# Patient Record
Sex: Male | Born: 1985 | Race: White | Hispanic: No | Marital: Single | Smoking: Current every day smoker
Health system: Southern US, Community
[De-identification: ages and names within clinical notes are randomized; demographics above are authoritative.]

## PROBLEM LIST (undated history)

## (undated) DIAGNOSIS — F32A Depression, unspecified: Secondary | ICD-10-CM

## (undated) DIAGNOSIS — J45909 Unspecified asthma, uncomplicated: Secondary | ICD-10-CM

## (undated) DIAGNOSIS — F419 Anxiety disorder, unspecified: Secondary | ICD-10-CM

## (undated) DIAGNOSIS — F329 Major depressive disorder, single episode, unspecified: Secondary | ICD-10-CM

## (undated) HISTORY — PX: TONSILLECTOMY: SUR1361

## (undated) HISTORY — PX: WISDOM TOOTH EXTRACTION: SHX21

---

## 2015-07-17 ENCOUNTER — Encounter (HOSPITAL_COMMUNITY): Payer: Self-pay | Admitting: Emergency Medicine

## 2015-07-17 ENCOUNTER — Emergency Department (HOSPITAL_COMMUNITY): Payer: BLUE CROSS/BLUE SHIELD

## 2015-07-17 ENCOUNTER — Emergency Department (HOSPITAL_COMMUNITY)
Admission: EM | Admit: 2015-07-17 | Discharge: 2015-07-17 | Disposition: A | Discharge status: Home / Self Care | Payer: BLUE CROSS/BLUE SHIELD | Attending: Emergency Medicine | Admitting: Emergency Medicine

## 2015-07-17 DIAGNOSIS — T148XXA Other injury of unspecified body region, initial encounter: Secondary | ICD-10-CM

## 2015-07-17 DIAGNOSIS — S61355A Open bite of left ring finger with damage to nail, initial encounter: Secondary | ICD-10-CM | POA: Diagnosis present

## 2015-07-17 DIAGNOSIS — Z203 Contact with and (suspected) exposure to rabies: Secondary | ICD-10-CM | POA: Diagnosis not present

## 2015-07-17 DIAGNOSIS — Y998 Other external cause status: Secondary | ICD-10-CM | POA: Diagnosis not present

## 2015-07-17 DIAGNOSIS — J45909 Unspecified asthma, uncomplicated: Secondary | ICD-10-CM | POA: Diagnosis not present

## 2015-07-17 DIAGNOSIS — Z23 Encounter for immunization: Secondary | ICD-10-CM | POA: Diagnosis not present

## 2015-07-17 DIAGNOSIS — F17210 Nicotine dependence, cigarettes, uncomplicated: Secondary | ICD-10-CM | POA: Insufficient documentation

## 2015-07-17 DIAGNOSIS — Y9289 Other specified places as the place of occurrence of the external cause: Secondary | ICD-10-CM | POA: Insufficient documentation

## 2015-07-17 DIAGNOSIS — Y9389 Activity, other specified: Secondary | ICD-10-CM | POA: Diagnosis not present

## 2015-07-17 DIAGNOSIS — W540XXA Bitten by dog, initial encounter: Secondary | ICD-10-CM | POA: Insufficient documentation

## 2015-07-17 DIAGNOSIS — Z8659 Personal history of other mental and behavioral disorders: Secondary | ICD-10-CM | POA: Insufficient documentation

## 2015-07-17 HISTORY — DX: Unspecified asthma, uncomplicated: J45.909

## 2015-07-17 HISTORY — DX: Major depressive disorder, single episode, unspecified: F32.9

## 2015-07-17 HISTORY — DX: Depression, unspecified: F32.A

## 2015-07-17 HISTORY — DX: Anxiety disorder, unspecified: F41.9

## 2015-07-17 MED ORDER — IBUPROFEN 800 MG PO TABS: 800.0000 mg | ORAL_TABLET | Freq: Three times a day (TID) | ORAL | Status: AC

## 2015-07-17 MED ORDER — RABIES VACCINE, PCEC IM SUSR
1.0000 mL | Freq: Once | INTRAMUSCULAR | Status: AC
  Administered 2015-07-17: 1 mL via INTRAMUSCULAR
  Filled 2015-07-17: qty 1

## 2015-07-17 MED ORDER — IBUPROFEN 400 MG PO TABS
800.0000 mg | ORAL_TABLET | Freq: Once | ORAL | Status: AC
  Administered 2015-07-17: 800 mg via ORAL
  Filled 2015-07-17: qty 2

## 2015-07-17 MED ORDER — RABIES IMMUNE GLOBULIN 150 UNIT/ML IM INJ
20.0000 [IU]/kg | INJECTION | Freq: Once | INTRAMUSCULAR | Status: AC
  Administered 2015-07-17: 1875 [IU] via INTRAMUSCULAR
  Filled 2015-07-17: qty 12.5

## 2015-07-17 MED ORDER — TETANUS-DIPHTH-ACELL PERTUSSIS 5-2.5-18.5 LF-MCG/0.5 IM SUSP
0.5000 mL | Freq: Once | INTRAMUSCULAR | Status: AC
  Administered 2015-07-17: 0.5 mL via INTRAMUSCULAR
  Filled 2015-07-17: qty 0.5

## 2015-07-17 MED ORDER — AMOXICILLIN-POT CLAVULANATE 875-125 MG PO TABS: 1.0000 | ORAL_TABLET | Freq: Two times a day (BID) | ORAL | Status: AC

## 2015-07-17 NOTE — ED Provider Notes (Signed)
CSN: [id number]     Arrival date & time 07/17/15  1408 History  By signing my name below, I, Scott Warren, attest that this documentation has been prepared under the direction and in the presence of [name], PA-C. Electronically Signed: Octavia Warren, ED Scribe. 07/17/2015. 3:10 PM.    Chief Complaint  Patient presents with  . Animal Bite      The history is provided by the patient and the police. No language interpreter was used.   HPI Comments: Scott Warren is a 30 y.o. male who presents to the Emergency Department complaining of an animal bite that occurred about one hour ago. Pt received an animal bite on his left ring finger after trying to break up a fight between his dog and another dog. He states his finger has been numb since 5 minutes after to being bit. Pt is unsure if the dog who bit him has a rabies vaccination. He is not up to date on his tetanus shot. He denies any other injury. Denies weakness, fever, chills, N/V.  Past Medical History  Diagnosis Date  . Anxiety   . Depression   . Asthma     childhood asthma   Past Surgical History  Procedure Laterality Date  . Tonsillectomy    . Wisdom tooth extraction     History reviewed. No pertinent family history. Social History  Substance Use Topics  . Smoking status: Current Every Day Smoker -- 0.50 packs/day    Types: Cigarettes  . Smokeless tobacco: Never Used  . Alcohol Use: No    Review of Systems  Skin: Positive for wound.  Neurological: Positive for numbness.  All other systems reviewed and are negative.     Allergies  Review of patient's allergies indicates no known allergies.  Home Medications   Prior to Admission medications   Medication Sig Start Date End Date Taking? Authorizing Provider  amoxicillin-clavulanate (AUGMENTIN) 875-125 MG tablet Take 1 tablet by mouth every 12 (twelve) hours. 07/17/15   [name], PA-C  ibuprofen (ADVIL,MOTRIN) 800 MG tablet Take 1 tablet (800 mg total) by mouth 3  (three) times daily. 07/17/15   [name], PA-C   Triage vitals: BP 139/79 mmHg  Pulse 110  Temp(Src) 99.3 F (37.4 C) (Oral)  Resp 20  SpO2 97% Physical Exam  Constitutional: He is oriented to person, place, and time. He appears well-developed and well-nourished.  HENT:  Head: Normocephalic and atraumatic.  Right Ear: External ear normal.  Left Ear: External ear normal.  Eyes: Conjunctivae are normal. No scleral icterus.  Neck: Normal range of motion. No tracheal deviation present.  Cardiovascular:  Capillary refill less than 3 seconds distal to injury.   Pulmonary/Chest: Effort normal. No respiratory distress.  Abdominal: He exhibits no distension.  Musculoskeletal: Normal range of motion.  FAROM of DIPJ, PIPJ, and MCP of left 4th digit.  Neurological: He is alert and oriented to person, place, and time.  Strength and sensation intact distal to injury.  Skin: Skin is warm and dry.  1 cm superficial incised wound to 4th digit finger pad. Matching superficial laceration to nail.  Nail intact and stable.  No nail bed or nail matrix involvement. No subungual hematoma. Bleeding controlled with pressure.  No palpation or visualization of foreign bodies within a bloodless field.   Psychiatric: He has a normal mood and affect. His behavior is normal.  Nursing note and vitals reviewed.   ED Course  Procedures   The wound is cleansed and dressed.  The patient is alerted to watch for any signs of infection (redness, pus, pain, increased swelling or fever) and call if such occurs. Home wound care instructions are provided. Tetanus vaccination status reviewed: Td vaccination indicated and given today.  DIAGNOSTIC STUDIES: Oxygen Saturation is 97% on RA, normal by my interpretation.  COORDINATION OF CARE:  3:00 PM Discussed treatment plan which include update tetanus and rabies shots with pt at bedside and pt agreed to plan.  Labs Review Labs Reviewed - No data to display  Imaging  Review Dg Finger Ring Left  07/17/2015  CLINICAL DATA:  Dog bite to palm and distal left ring finger. EXAM: LEFT RING FINGER 2+V COMPARISON:  None. FINDINGS: Soft tissue swelling over the tip of the left ring finger. No underlying bony abnormality. No fracture, subluxation or dislocation. No radiopaque foreign bodies. IMPRESSION: No acute bony abnormality or radiopaque foreign body. Electronically Signed   By: Charlett Nose M.D.   On: 07/17/2015 15:39   I have personally reviewed and evaluated these images and lab results as part of my medical decision-making.   EKG Interpretation None      MDM   Final diagnoses:  Animal bite  Need for post exposure prophylaxis for rabies    Patient presents with dog bite with animal status unknown.  VSS, NAD.  1 cm incised wound to 4th digit finger pad.  NVI distal to injury.  Plain films negative for FB.  Wound extensively cleaned.  Patient received Tdap, rabies immune globulin, and rabies vaccine in ED.  Plan to discharge home with Augmentin and Ibuprofen.  Follow up with this department or [facility] for remaining rabies vaccinations on days 3 (March 1), 7 (March 5), and 14 (March 12).  I personally performed the services described in this documentation, which was scribed in my presence. The recorded information has been reviewed and is accurate. NA andin  [name], PA-C 07/17/15 1617  Blane Ohara, MD 07/19/15 [phone]

## 2015-07-17 NOTE — ED Notes (Signed)
Pt from home with c/o left 4th digit dog bite occuring today.  Reports another dog bit his dog and he got in between to break up the fight.  Pt does not know the other dog or dog's owner.  Bleeding controlled, NAD, A&O.

## 2015-07-17 NOTE — Discharge Instructions (Signed)
Go to Urgent Care to receive your remaining rabies vaccinations.  Today is day 0.  You need shots on Day 3 (Mar 1), 7 (March 5), and 14 (March 12).  Rabies Rabies is a viral infection that can be spread to people from infected animals. The infection affects the brain and central nervous system. Once the disease develops, it almost always causes death. Because of this, when a person is bitten by an animal that may have rabies, treatment to prevent rabies often needs to be started whether or not the animal is known to be infected. Prompt treatment with the rabies vaccine and rabies immune globulin is very effective at preventing the infection from developing in people who have been exposed to the rabies virus. CAUSES  Rabies is caused by a virus that lives inside some animals. When a person is bitten by an infected animal, the rabies virus is spread to the person through the infected spit (saliva) of the animal. This virus can be carried by animals such as dogs, cats, skunks, bats, woodchucks, raccoons, coyotes, and foxes. SYMPTOMS  By the time symptoms appear, rabies is usually fatal for the person. Common symptoms include:  Headache.  Fever.  Fatigue and weakness.  Agitation.  Anxiety.  Confusion.  Unusual behavior, such as hyperactivity, fear of water (hydrophobia), or fear of air (aerophobia).  Hallucinations.  Insomnia.  Weakness in the arms or legs.  Difficulty swallowing. Most people get sick in 1-3 months after being bitten. This often varies and may depend on the location of the bite. The infection will take less time to develop if the bite occurred closer to the head.  DIAGNOSIS  To determine if a person is infected, several tests must be performed, such as:  A skin biopsy.  A saliva test.  A lumbar puncture to remove spinal fluid so it can be examined.  Blood tests. TREATMENT  Treatment to prevent the infection from developing (post-exposure prophylaxis, PEP) is  often started before knowing for sure if the person has been exposed to the rabies virus. PEP involves cleaning the wound, giving an antibody injection (rabies immune globulin), and giving a series of rabies vaccine injections. The series of injections are usually given over a two-week period. If possible, the animal that bit the person will be observed to see if it remains healthy. If the animal has been killed, it can be sent to a state laboratory and examined to see if the animal had rabies. If a person is bitten by a domestic animal (dog, cat, or ferret) that appears healthy and can be observed to see if it remains healthy, often no further treatment is necessary other than care of the wounds caused by the animal. Rabies is often a fatal illness once the infection develops in a person. Although a few people who developed rabies have survived after experimental treatment with certain drugs, all these survivors still had severe nervous system problems after the treatment. This is why caregivers use extra caution and begin PEP treatment for people who have been bitten by animals that are possibly infected with rabies.  HOME CARE INSTRUCTIONS  If you were bitten by an unknown animal, make sure you know your caregiver's instructions for follow-up. If the animal was sent to a laboratory for examination, ask when the test results will be ready. Make sure you get the test results.  Take these steps to care for your wound:  Keep the wound clean, dry, and dressed as directed by your caregiver.  Keep the injured part elevated as much as possible.  Do not resume use of the affected area until directed.  Only take over-the-counter or prescription medicines as directed by your caregiver.  Keep all follow-up appointments as directed by your caregiver. PREVENTION  To prevent rabies, people need to reduce their risk of having contact with infected animals.   Make sure your pets (dogs, cats, ferrets) are  vaccinated against rabies. Keep these vaccinations up-to-date as directed by your veterinarian.  Supervise your pets when they are outside. Keep them away from wild animals.  Call your local animal control services to report any stray animals. These animals may not be vaccinated.  Stay away from stray or wild animals.  Consider getting the rabies vaccine (preexposure) if you are traveling to an area where rabies is common or if your job or activities involve possible contact with wild or stray animals. Discuss this with your caregiver.   This information is not intended to replace advice given to you by your health care provider. Make sure you discuss any questions you have with your health care provider.   Document Released: 05/07/2005 Document Revised: 05/28/2014 Document Reviewed: 12/04/2011 Elsevier Interactive Patient Education [employer]

## 2015-07-17 NOTE — ED Notes (Signed)
Declined W/C at D/C and was escorted to lobby by RN. 

## 2015-07-20 ENCOUNTER — Encounter (HOSPITAL_COMMUNITY): Payer: Self-pay | Admitting: Emergency Medicine

## 2015-07-20 ENCOUNTER — Emergency Department (INDEPENDENT_AMBULATORY_CARE_PROVIDER_SITE_OTHER)
Admission: EM | Admit: 2015-07-20 | Discharge: 2015-07-20 | Disposition: A | Discharge status: Home / Self Care | Payer: BLUE CROSS/BLUE SHIELD | Source: Home / Self Care | Attending: Family Medicine | Admitting: Family Medicine

## 2015-07-20 DIAGNOSIS — Z203 Contact with and (suspected) exposure to rabies: Secondary | ICD-10-CM | POA: Diagnosis not present

## 2015-07-20 MED ORDER — RABIES VACCINE, PCEC IM SUSR
1.0000 mL | Freq: Once | INTRAMUSCULAR | Status: AC
Start: 2015-07-20 — End: 2015-07-20
  Administered 2015-07-20: 1 mL via INTRAMUSCULAR

## 2015-07-20 MED ORDER — RABIES VACCINE, PCEC IM SUSR
INTRAMUSCULAR | Status: AC
  Filled 2015-07-20: qty 1

## 2015-07-20 NOTE — ED Notes (Signed)
Pt is here for his second injection in his rabies series.  He received his immunoglobulin and 1st rabies vaccine on 07/17/15.  Wound was covered by a dirty bandage.  Wound is marginally clean, with bandage sticking to the wound.  Wound does not look infected at this time.  No swelling, redness, or pain and no fever reported.

## 2015-07-24 ENCOUNTER — Emergency Department (INDEPENDENT_AMBULATORY_CARE_PROVIDER_SITE_OTHER)
Admission: EM | Admit: 2015-07-24 | Discharge: 2015-07-24 | Disposition: A | Discharge status: Home / Self Care | Payer: BLUE CROSS/BLUE SHIELD | Source: Home / Self Care

## 2015-07-24 ENCOUNTER — Encounter (HOSPITAL_COMMUNITY): Payer: Self-pay | Admitting: Emergency Medicine

## 2015-07-24 DIAGNOSIS — Z203 Contact with and (suspected) exposure to rabies: Secondary | ICD-10-CM | POA: Diagnosis not present

## 2015-07-24 MED ORDER — RABIES VACCINE, PCEC IM SUSR
1.0000 mL | Freq: Once | INTRAMUSCULAR | Status: AC
  Administered 2015-07-24: 1 mL via INTRAMUSCULAR

## 2015-07-24 MED ORDER — RABIES VACCINE, PCEC IM SUSR
INTRAMUSCULAR | Status: AC
  Filled 2015-07-24: qty 1

## 2015-07-24 NOTE — ED Provider Notes (Signed)
CSN: [id number]     Arrival date & time 07/20/15  1812 History   First MD Initiated Contact with Patient 07/20/15 1903     Chief Complaint  Patient presents with  . Rabies Injection   (Consider location/radiation/quality/duration/timing/severity/associated sxs/prior Treatment) HPI  Past Medical History  Diagnosis Date  . Anxiety   . Depression   . Asthma     childhood asthma   Past Surgical History  Procedure Laterality Date  . Tonsillectomy    . Wisdom tooth extraction     History reviewed. No pertinent family history. Social History  Substance Use Topics  . Smoking status: Current Every Day Smoker -- 0.50 packs/day    Types: Cigarettes  . Smokeless tobacco: Never Used  . Alcohol Use: No    Review of Systems  Allergies  Review of patient's allergies indicates no known allergies.  Home Medications   Prior to Admission medications   Medication Sig Start Date End Date Taking? Authorizing Provider  amoxicillin-clavulanate (AUGMENTIN) 875-125 MG tablet Take 1 tablet by mouth every 12 (twelve) hours. 07/17/15  Yes Kayla Rose, PA-C  ibuprofen (ADVIL,MOTRIN) 800 MG tablet Take 1 tablet (800 mg total) by mouth 3 (three) times daily. 07/17/15  Yes Cheri FowlerKayla Rose, PA-C   Meds Ordered and Administered this Visit   Medications  rabies vaccine (RABAVERT) injection 1 mL (1 mL Intramuscular Given 07/20/15 1918)    BP 129/77 mmHg  Pulse 99  Temp(Src) 98.3 F (36.8 C) (Oral)  Resp 16  SpO2 99% No data found.   Physical Exam  ED Course  Procedures (including critical care time)  Labs Review Labs Reviewed - No data to display  Imaging Review No results found.   Visual Acuity Review  Right Eye Distance:   Left Eye Distance:   Bilateral Distance:    Right Eye Near:   Left Eye Near:    Bilateral Near:         MDM  No diagnosis found.     Linna HoffJames D Tanna Loeffler, MD 07/24/15 1318

## 2015-07-24 NOTE — Discharge Instructions (Signed)
Return on 07/31/2015 for next injection.

## 2015-07-24 NOTE — ED Notes (Signed)
[facility] rabies injection in his series. [facility].

## 2016-07-05 IMAGING — DX DG FINGER RING 2+V*L*
3 series · 3 of 3 positions shown · non-contrast
Comparison: None.

CLINICAL DATA: Dog bite to palm and distal left ring finger.

EXAM:
LEFT RING FINGER 2+V

[finger ap]
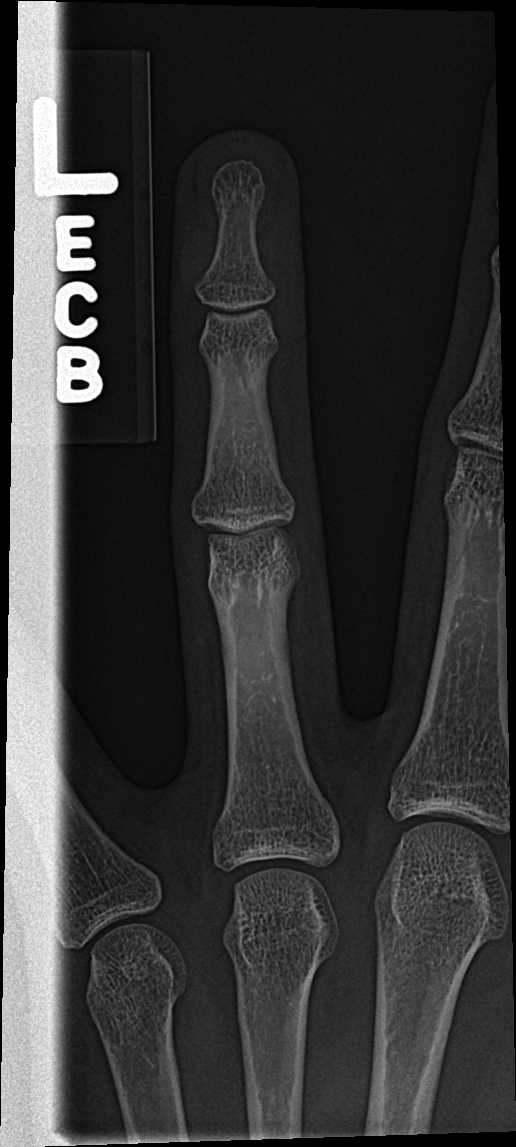

[finger obl]
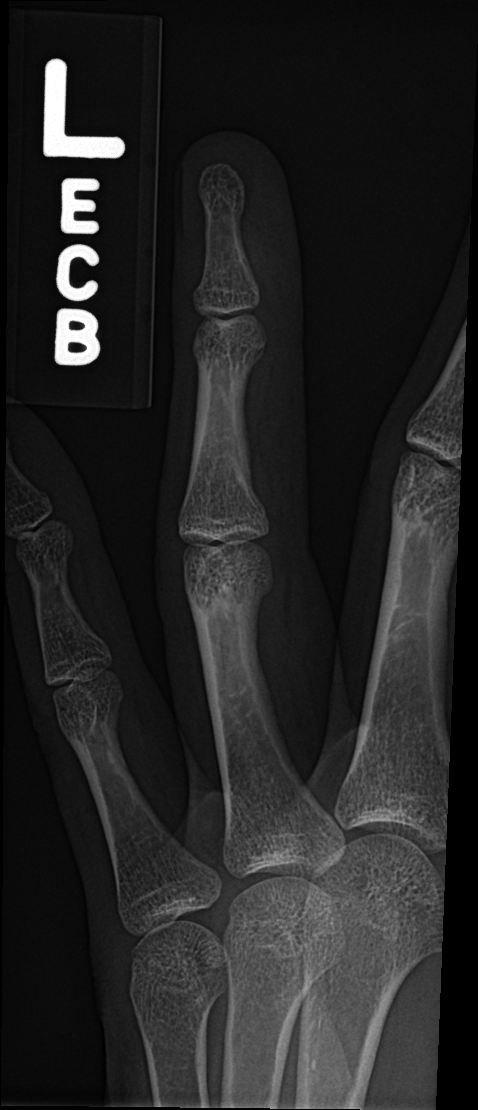

[finger lat]
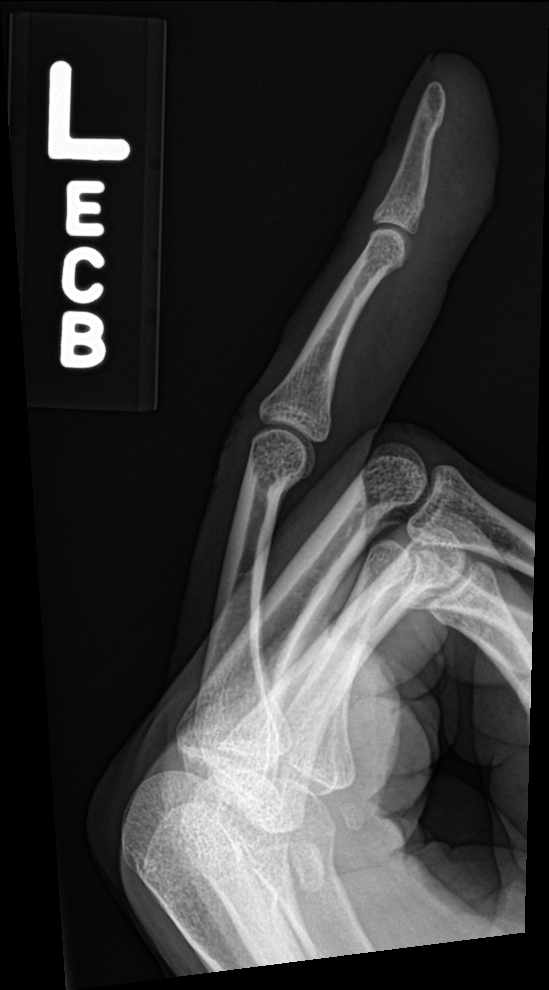

[3 of 3 positions shown; findings below may reference images not displayed]

FINDINGS: Soft tissue swelling over the tip of the left ring finger. No
underlying bony abnormality. No fracture, subluxation or
dislocation. No radiopaque foreign bodies.
IMPRESSION: No acute bony abnormality or radiopaque foreign body.
